# Patient Record
Sex: Female | Born: 1955 | Race: Black or African American | Hispanic: No | Marital: Single | Smoking: Current every day smoker
Health system: Southern US, Community
[De-identification: ages and names within clinical notes are randomized; demographics above are authoritative.]

## PROBLEM LIST (undated history)

## (undated) DIAGNOSIS — J449 Chronic obstructive pulmonary disease, unspecified: Secondary | ICD-10-CM

## (undated) DIAGNOSIS — G8929 Other chronic pain: Secondary | ICD-10-CM

---

## 2005-03-03 ENCOUNTER — Emergency Department: Payer: Self-pay | Admitting: General Practice

## 2005-11-08 ENCOUNTER — Emergency Department: Payer: Self-pay | Admitting: Emergency Medicine

## 2008-09-21 ENCOUNTER — Emergency Department: Payer: Self-pay | Admitting: Emergency Medicine

## 2009-03-28 ENCOUNTER — Emergency Department: Payer: Self-pay | Admitting: Emergency Medicine

## 2009-04-21 ENCOUNTER — Emergency Department: Payer: Self-pay | Admitting: Emergency Medicine

## 2009-05-14 ENCOUNTER — Inpatient Hospital Stay: Payer: Self-pay | Admitting: Internal Medicine

## 2009-08-22 ENCOUNTER — Encounter: Payer: Self-pay | Admitting: Family Medicine

## 2009-09-05 ENCOUNTER — Encounter: Payer: Self-pay | Admitting: Family Medicine

## 2009-10-13 ENCOUNTER — Ambulatory Visit: Payer: Self-pay | Admitting: Family Medicine

## 2010-09-06 ENCOUNTER — Emergency Department: Payer: Self-pay | Admitting: Unknown Physician Specialty

## 2011-06-01 ENCOUNTER — Emergency Department: Payer: Self-pay | Admitting: Emergency Medicine

## 2011-06-06 ENCOUNTER — Emergency Department: Payer: Self-pay | Admitting: Emergency Medicine

## 2011-06-29 ENCOUNTER — Emergency Department: Payer: Self-pay | Admitting: Emergency Medicine

## 2011-08-01 ENCOUNTER — Ambulatory Visit: Payer: Self-pay | Admitting: Pain Medicine

## 2011-12-04 ENCOUNTER — Emergency Department: Payer: Self-pay | Admitting: *Deleted

## 2011-12-04 LAB — CBC WITH DIFFERENTIAL/PLATELET
Eosinophil #: 0.1 10*3/uL (ref 0.0–0.7)
Eosinophil %: 0.9 %
HGB: 13.1 g/dL (ref 12.0–16.0)
Lymphocyte #: 1.4 10*3/uL (ref 1.0–3.6)
Lymphocyte %: 21.5 %
Monocyte %: 5.9 %
Platelet: 168 10*3/uL (ref 150–440)
RBC: 4.62 10*6/uL (ref 3.80–5.20)
WBC: 6.5 10*3/uL (ref 3.6–11.0)

## 2011-12-04 LAB — BASIC METABOLIC PANEL
Calcium, Total: 9.7 mg/dL (ref 8.5–10.1)
Chloride: 100 mmol/L (ref 98–107)
Glucose: 100 mg/dL — ABNORMAL HIGH (ref 65–99)
Osmolality: 274 (ref 275–301)
Potassium: 3.5 mmol/L (ref 3.5–5.1)
Sodium: 138 mmol/L (ref 136–145)

## 2012-05-24 ENCOUNTER — Emergency Department: Payer: Self-pay | Admitting: Emergency Medicine

## 2012-05-24 LAB — URINALYSIS, COMPLETE
Bilirubin,UR: NEGATIVE
Ketone: NEGATIVE
Ph: 5 (ref 4.5–8.0)
Specific Gravity: 1.011 (ref 1.003–1.030)
Squamous Epithelial: 9

## 2012-05-26 LAB — URINE CULTURE

## 2012-07-26 ENCOUNTER — Emergency Department: Payer: Self-pay | Admitting: Unknown Physician Specialty

## 2012-07-26 LAB — URINALYSIS, COMPLETE
Bilirubin,UR: NEGATIVE
Glucose,UR: NEGATIVE mg/dL (ref 0–75)
Ketone: NEGATIVE
Nitrite: NEGATIVE
Ph: 6 (ref 4.5–8.0)
RBC,UR: 2 /HPF (ref 0–5)
WBC UR: <1 /HPF (ref 0–5)

## 2012-07-29 ENCOUNTER — Emergency Department: Payer: Self-pay | Admitting: Emergency Medicine

## 2013-02-22 ENCOUNTER — Emergency Department: Payer: Self-pay | Admitting: Unknown Physician Specialty

## 2013-02-22 LAB — URINALYSIS, COMPLETE
Bilirubin,UR: NEGATIVE
Blood: NEGATIVE
Hyaline Cast: 1
Protein: NEGATIVE
RBC,UR: 2 /HPF (ref 0–5)
Specific Gravity: 1.018 (ref 1.003–1.030)
Squamous Epithelial: 10
WBC UR: 6 /HPF (ref 0–5)

## 2013-02-22 LAB — CBC
HCT: 37.2 % (ref 35.0–47.0)
MCHC: 33.2 g/dL (ref 32.0–36.0)
MCV: 86 fL (ref 80–100)
RBC: 4.35 10*6/uL (ref 3.80–5.20)
RDW: 13.2 % (ref 11.5–14.5)

## 2013-02-22 LAB — COMPREHENSIVE METABOLIC PANEL
Alkaline Phosphatase: 84 U/L (ref 50–136)
Anion Gap: 7 (ref 7–16)
BUN: 15 mg/dL (ref 7–18)
Chloride: 105 mmol/L (ref 98–107)
Co2: 27 mmol/L (ref 21–32)
EGFR (African American): >60
EGFR (Non-African Amer.): >60
Glucose: 92 mg/dL (ref 65–99)
Potassium: 3.4 mmol/L — ABNORMAL LOW (ref 3.5–5.1)
SGOT(AST): 18 U/L (ref 15–37)
SGPT (ALT): 14 U/L (ref 12–78)
Sodium: 139 mmol/L (ref 136–145)
Total Protein: 6.8 g/dL (ref 6.4–8.2)

## 2013-02-22 LAB — TROPONIN I: Troponin-I: <0.02 ng/mL

## 2013-05-24 ENCOUNTER — Emergency Department: Payer: Self-pay | Admitting: Emergency Medicine

## 2013-05-24 LAB — COMPREHENSIVE METABOLIC PANEL
Anion Gap: 4 — ABNORMAL LOW (ref 7–16)
Bilirubin,Total: 0.8 mg/dL (ref 0.2–1.0)
Calcium, Total: 9.3 mg/dL (ref 8.5–10.1)
Co2: 28 mmol/L (ref 21–32)
Glucose: 83 mg/dL (ref 65–99)
Potassium: 3.9 mmol/L (ref 3.5–5.1)
SGOT(AST): 23 U/L (ref 15–37)
SGPT (ALT): 19 U/L (ref 12–78)
Sodium: 137 mmol/L (ref 136–145)
Total Protein: 7.3 g/dL (ref 6.4–8.2)

## 2013-05-24 LAB — URINALYSIS, COMPLETE
Bilirubin,UR: NEGATIVE
Ketone: NEGATIVE
Protein: NEGATIVE
Squamous Epithelial: 18

## 2013-05-24 LAB — LIPASE, BLOOD: Lipase: 53 U/L — ABNORMAL LOW (ref 73–393)

## 2013-05-24 LAB — CBC
HCT: 38.1 % (ref 35.0–47.0)
HGB: 12.9 g/dL (ref 12.0–16.0)
MCV: 84 fL (ref 80–100)
Platelet: 194 10*3/uL (ref 150–440)
RBC: 4.54 10*6/uL (ref 3.80–5.20)
RDW: 13.2 % (ref 11.5–14.5)
WBC: 5.1 10*3/uL (ref 3.6–11.0)

## 2013-08-16 ENCOUNTER — Emergency Department: Payer: Self-pay | Admitting: Emergency Medicine

## 2014-05-31 ENCOUNTER — Emergency Department: Payer: Self-pay | Admitting: Emergency Medicine

## 2014-12-30 ENCOUNTER — Ambulatory Visit: Payer: Self-pay | Admitting: Dentistry

## 2015-02-25 ENCOUNTER — Other Ambulatory Visit: Payer: Self-pay | Admitting: Pediatrics

## 2015-02-25 DIAGNOSIS — F32A Depression, unspecified: Secondary | ICD-10-CM

## 2015-02-25 DIAGNOSIS — F329 Major depressive disorder, single episode, unspecified: Secondary | ICD-10-CM

## 2015-02-25 DIAGNOSIS — F419 Anxiety disorder, unspecified: Principal | ICD-10-CM

## 2015-02-25 DIAGNOSIS — R200 Anesthesia of skin: Secondary | ICD-10-CM

## 2015-02-28 ENCOUNTER — Other Ambulatory Visit: Payer: Self-pay | Admitting: Pediatrics

## 2015-02-28 DIAGNOSIS — F329 Major depressive disorder, single episode, unspecified: Secondary | ICD-10-CM

## 2015-02-28 DIAGNOSIS — F419 Anxiety disorder, unspecified: Principal | ICD-10-CM

## 2015-03-02 ENCOUNTER — Other Ambulatory Visit: Payer: Self-pay | Admitting: Pediatrics

## 2015-03-02 DIAGNOSIS — F419 Anxiety disorder, unspecified: Secondary | ICD-10-CM

## 2015-03-02 DIAGNOSIS — R200 Anesthesia of skin: Secondary | ICD-10-CM

## 2015-03-02 DIAGNOSIS — F32A Depression, unspecified: Secondary | ICD-10-CM

## 2015-03-02 DIAGNOSIS — F329 Major depressive disorder, single episode, unspecified: Secondary | ICD-10-CM

## 2015-12-28 ENCOUNTER — Telehealth: Payer: Self-pay | Admitting: Unknown Physician Specialty

## 2015-12-28 NOTE — Telephone Encounter (Signed)
Discussed with radiologist as pt is complaining of a milky discharge. He suggested a breast MRI if continuing symptoms

## 2020-03-15 ENCOUNTER — Other Ambulatory Visit: Payer: Self-pay

## 2020-03-15 ENCOUNTER — Emergency Department: Payer: Medicare (Managed Care)

## 2020-03-15 ENCOUNTER — Emergency Department
Admission: EM | Admit: 2020-03-15 | Discharge: 2020-03-16 | Disposition: A | Discharge status: Home / Self Care | Payer: Medicare (Managed Care) | Attending: Student in an Organized Health Care Education/Training Program | Admitting: Student in an Organized Health Care Education/Training Program

## 2020-03-15 DIAGNOSIS — R464 Slowness and poor responsiveness: Secondary | ICD-10-CM | POA: Insufficient documentation

## 2020-03-15 DIAGNOSIS — T40601A Poisoning by unspecified narcotics, accidental (unintentional), initial encounter: Secondary | ICD-10-CM | POA: Insufficient documentation

## 2020-03-15 DIAGNOSIS — J449 Chronic obstructive pulmonary disease, unspecified: Secondary | ICD-10-CM | POA: Insufficient documentation

## 2020-03-15 DIAGNOSIS — F17210 Nicotine dependence, cigarettes, uncomplicated: Secondary | ICD-10-CM | POA: Insufficient documentation

## 2020-03-15 DIAGNOSIS — R4189 Other symptoms and signs involving cognitive functions and awareness: Secondary | ICD-10-CM

## 2020-03-15 HISTORY — DX: Chronic obstructive pulmonary disease, unspecified: J44.9

## 2020-03-15 LAB — COMPREHENSIVE METABOLIC PANEL
ALT: 17 U/L (ref 0–44)
AST: 20 U/L (ref 15–41)
Albumin: 4.6 g/dL (ref 3.5–5.0)
Alkaline Phosphatase: 74 U/L (ref 38–126)
Anion gap: 7 (ref 5–15)
BUN: 22 mg/dL (ref 8–23)
CO2: 31 mmol/L (ref 22–32)
Calcium: 9.7 mg/dL (ref 8.9–10.3)
Chloride: 102 mmol/L (ref 98–111)
Creatinine, Ser: 0.94 mg/dL (ref 0.44–1.00)
GFR calc Af Amer: >60 mL/min (ref >60)
GFR calc non Af Amer: >60 mL/min (ref >60)
Glucose, Bld: 117 mg/dL — ABNORMAL HIGH (ref 70–99)
Potassium: 3.7 mmol/L (ref 3.5–5.1)
Sodium: 140 mmol/L (ref 135–145)
Total Bilirubin: 0.7 mg/dL (ref 0.3–1.2)
Total Protein: 7.7 g/dL (ref 6.5–8.1)

## 2020-03-15 LAB — CBC WITH DIFFERENTIAL/PLATELET
Abs Immature Granulocytes: 0.01 10*3/uL (ref 0.00–0.07)
Basophils Absolute: 0 10*3/uL (ref 0.0–0.1)
Basophils Relative: 1 %
Eosinophils Absolute: 0.4 10*3/uL (ref 0.0–0.5)
Eosinophils Relative: 5 %
HCT: 40.4 % (ref 36.0–46.0)
Hemoglobin: 13.4 g/dL (ref 12.0–15.0)
Immature Granulocytes: 0 %
Lymphocytes Relative: 37 %
Lymphs Abs: 2.8 10*3/uL (ref 0.7–4.0)
MCH: 29.4 pg (ref 26.0–34.0)
MCHC: 33.2 g/dL (ref 30.0–36.0)
MCV: 88.6 fL (ref 80.0–100.0)
Monocytes Absolute: 0.8 10*3/uL (ref 0.1–1.0)
Monocytes Relative: 11 %
Neutro Abs: 3.5 10*3/uL (ref 1.7–7.7)
Neutrophils Relative %: 46 %
Platelets: 216 10*3/uL (ref 150–400)
RBC: 4.56 MIL/uL (ref 3.87–5.11)
RDW: 13.4 % (ref 11.5–15.5)
WBC: 7.5 10*3/uL (ref 4.0–10.5)
nRBC: 0 % (ref 0.0–0.2)

## 2020-03-15 LAB — TROPONIN I (HIGH SENSITIVITY): Troponin I (High Sensitivity): 7 ng/L (ref <18)

## 2020-03-15 NOTE — ED Provider Notes (Signed)
[facility] Emergency Department Provider Note    First MD Initiated Contact with Patient 03/15/20 2237     (approximate)  I have reviewed the triage vital signs and the nursing notes.   HISTORY  Chief Complaint Loss of Consciousness    HPI Pamela Mcclure is a 64 y.o. female history of COPD as well as chronic pain states that she takes OxyContin at home states that she was drinking with friend this evening.  EMS was called due to altered mental status and unresponsiveness.  Patient had slow respirations initially requiring assisted ventilations.  She was given IV Narcan with return of spontaneous respirations and resolution of her altered mental status.  She denies any pain no chest pain or shortness of breath.  Denies any intentional overdose.  Does admit to taking pain medication tonight and EtOH.    Past Medical History:  Diagnosis Date  . COPD (chronic obstructive pulmonary disease) (HCC)    No family history on file. History reviewed. No pertinent surgical history. There are no problems to display for this patient.     Prior to Admission medications   Not on File    Allergies Patient has no known allergies.    Social History Social History   Tobacco Use  . Smoking status: Current Every Day Smoker    Packs/day: 0.50  . Smokeless tobacco: Never Used  Substance Use Topics  . Alcohol use: Yes    Comment: not daily   . Drug use: Never    Review of Systems Patient denies headaches, rhinorrhea, blurry vision, numbness, shortness of breath, chest pain, edema, cough, abdominal pain, nausea, vomiting, diarrhea, dysuria, fevers, rashes or hallucinations unless otherwise stated above in HPI. ____________________________________________   PHYSICAL EXAM:  VITAL SIGNS: Vitals:   03/15/20 2226  BP: (!) 149/91  Pulse: 89  Resp: 15  Temp: 98 F (36.7 C)  SpO2: 97%    Constitutional: Alert and oriented.  Eyes: Conjunctivae are normal.  Pupils 61mm and reactive  Head: Atraumatic. Nose: No congestion/rhinnorhea. Mouth/Throat: Mucous membranes are moist.   Neck: No stridor. Painless ROM.  Cardiovascular: Normal rate, regular rhythm. Grossly normal heart sounds.  Good peripheral circulation. Respiratory: Normal respiratory effort.  No retractions. Lungs CTAB. Gastrointestinal: Soft and nontender. No distention. No abdominal bruits. No CVA tenderness. Genitourinary: deferred Musculoskeletal: No lower extremity tenderness nor edema.  No joint effusions. Neurologic:  CN- intact.  No facial droop, Normal FNF.  Normal heel to shin.  Sensation intact bilaterally. Normal speech and language. No gross focal neurologic deficits are appreciated. No gait instability.  Skin:  Skin is warm, dry and intact. No rash noted. Psychiatric: Mood and affect are normal. Speech and behavior are normal.  ____________________________________________   LABS (all labs ordered are listed, but only abnormal results are displayed)  Results for orders placed or performed during the hospital encounter of 03/15/20 (from the past 24 hour(s))  CBC with Differential/Platelet     Status: None   Collection Time: 03/15/20 10:38 PM  Result Value Ref Range   WBC 7.5 4.0 - 10.5 K/uL   RBC 4.56 3.87 - 5.11 MIL/uL   Hemoglobin 13.4 12.0 - 15.0 g/dL   HCT 96.7 89.3 - 81.0 %   MCV 88.6 80.0 - 100.0 fL   MCH 29.4 26.0 - 34.0 pg   MCHC 33.2 30.0 - 36.0 g/dL   RDW 17.5 10.2 - 58.5 %   Platelets 216 150 - 400 K/uL   nRBC 0.0 0.0 -  0.2 %   Neutrophils Relative % 46 %   Neutro Abs 3.5 1.7 - 7.7 K/uL   Lymphocytes Relative 37 %   Lymphs Abs 2.8 0.7 - 4.0 K/uL   Monocytes Relative 11 %   Monocytes Absolute 0.8 0.1 - 1.0 K/uL   Eosinophils Relative 5 %   Eosinophils Absolute 0.4 0.0 - 0.5 K/uL   Basophils Relative 1 %   Basophils Absolute 0.0 0.0 - 0.1 K/uL   Immature Granulocytes 0 %   Abs Immature Granulocytes 0.01 0.00 - 0.07 K/uL  Comprehensive metabolic  panel     Status: Abnormal   Collection Time: 03/15/20 10:38 PM  Result Value Ref Range   Sodium 140 135 - 145 mmol/L   Potassium 3.7 3.5 - 5.1 mmol/L   Chloride 102 98 - 111 mmol/L   CO2 31 22 - 32 mmol/L   Glucose, Bld 117 (H) 70 - 99 mg/dL   BUN 22 8 - 23 mg/dL   Creatinine, Ser 2.69 0.44 - 1.00 mg/dL   Calcium 9.7 8.9 - 48.5 mg/dL   Total Protein 7.7 6.5 - 8.1 g/dL   Albumin 4.6 3.5 - 5.0 g/dL   AST 20 15 - 41 U/L   ALT 17 0 - 44 U/L   Alkaline Phosphatase 74 38 - 126 U/L   Total Bilirubin 0.7 0.3 - 1.2 mg/dL   GFR calc non Af Amer >60 >60 mL/min   GFR calc Af Amer >60 >60 mL/min   Anion gap 7 5 - 15  Troponin I (High Sensitivity)     Status: None   Collection Time: 03/15/20 10:38 PM  Result Value Ref Range   Troponin I (High Sensitivity) 7 <18 ng/L   ____________________________________________  EKG My review and personal interpretation at Time: 22:26   Indication: unresponsive  Rate: 85  Rhythm: sinus Axis: normal Other: normal intervals, no stemi ____________________________________________  RADIOLOGY  I personally reviewed all radiographic images ordered to evaluate for the above acute complaints and reviewed radiology reports and findings.  These findings were personally discussed with the patient.  Please see medical record for radiology report.  ____________________________________________   PROCEDURES  Procedure(s) performed:  Procedures    Critical Care performed: no ____________________________________________   INITIAL IMPRESSION / ASSESSMENT AND PLAN / ED COURSE  Pertinent labs & imaging results that were available during my care of the patient were reviewed by me and considered in my medical decision making (see chart for details).   DDX: Overdose, dysrhythmia, electrolyte abnormality, CHF, respiratory failure, TIA, CVA  Pamela Mcclure is a 64 y.o. who presents to the ED with symptoms as described above.  Patient now nontoxic-appearing with  spontaneous respirations after receiving Narcan.  Does admit to drinking and taking pain medication for chronic back pain.  Denies any intentional overdose.  Will check blood work as well as imaging given her age and risk factors.  Patient be signed out to oncoming physician pending period of observation and reassessment.  The patient will be placed on continuous pulse oximetry and telemetry for monitoring.  Laboratory evaluation will be sent to evaluate for the above complaints.        The patient was evaluated in Emergency Department today for the symptoms described in the history of present illness. He/she was evaluated in the context of the global COVID-19 pandemic, which necessitated consideration that the patient might be at risk for infection with the SARS-CoV-2 virus that causes COVID-19. Institutional protocols and algorithms that pertain to the  evaluation of patients at risk for COVID-19 are in a state of rapid change based on information released by regulatory bodies including the CDC and federal and state organizations. These policies and algorithms were followed during the patient's care in the ED.  As part of my medical decision making, I reviewed the following data within the [facility] notes reviewed and incorporated, Labs reviewed, notes from prior ED visits and [place] Controlled Substance Database   ____________________________________________   FINAL CLINICAL IMPRESSION(S) / ED DIAGNOSES  Final diagnoses:  Episode of unresponsiveness      NEW MEDICATIONS STARTED DURING THIS VISIT:  New Prescriptions   No medications on file     Note:  This document was prepared using Dragon voice recognition software and may include unintentional dictation errors.    Merlyn Lot, MD 03/15/20 2312

## 2020-03-15 NOTE — ED Notes (Signed)
Attempted to get urine sample from pt, pt states "I'm alright". Requesting something to eat. Will check with MD after scan has resulted. Warm blanket given.

## 2020-03-15 NOTE — ED Notes (Signed)
Pt returned from ct

## 2020-03-15 NOTE — ED Triage Notes (Signed)
PT to ED via EMS from her cousins house. PT was takling to cousin and developed right upward gave and went unresponsive. Upon EMS arrival, pt was apneac. EMS bagged pt for a few minutes, gave 1mg  Narcan IV and pt becAME MORE alert and breathing on her own. Upon arrival pt is alert and oriented 99% on room air.

## 2020-03-16 NOTE — ED Provider Notes (Addendum)
I assumed care of the patient from Dr. Roxan Hockey at 11:00 PM.  Patient son presented to the emergency department.  I spoke with the patient after she excused her son from the room.  Patient informed me that she crushed and snorted to OxyContin 10 mg tablets today.  She states that she usually presents notes her OxyContin however today she "overdid it".  Patient denied any suicidal ideation.  I spoke to the patient at length regarding the dangers of doing so and that it could have resulted in her death today.  Patient voiced understanding.  Patient will be discharged home in the custody of her son.   Darci Current, MD 03/16/20 1593    Darci Current, MD 03/16/20 [phone]

## 2020-05-03 ENCOUNTER — Emergency Department
Admission: EM | Admit: 2020-05-03 | Discharge: 2020-05-03 | Disposition: A | Discharge status: Home / Self Care | Payer: Medicare (Managed Care) | Attending: Emergency Medicine | Admitting: Emergency Medicine

## 2020-05-03 ENCOUNTER — Encounter: Payer: Self-pay | Admitting: Emergency Medicine

## 2020-05-03 ENCOUNTER — Other Ambulatory Visit: Payer: Self-pay

## 2020-05-03 DIAGNOSIS — J449 Chronic obstructive pulmonary disease, unspecified: Secondary | ICD-10-CM | POA: Diagnosis not present

## 2020-05-03 DIAGNOSIS — F17210 Nicotine dependence, cigarettes, uncomplicated: Secondary | ICD-10-CM | POA: Diagnosis not present

## 2020-05-03 DIAGNOSIS — M545 Low back pain: Secondary | ICD-10-CM | POA: Diagnosis present

## 2020-05-03 DIAGNOSIS — G8929 Other chronic pain: Secondary | ICD-10-CM

## 2020-05-03 HISTORY — DX: Other chronic pain: G89.29

## 2020-05-03 MED ORDER — MORPHINE SULFATE (PF) 4 MG/ML IV SOLN
4.0000 mg | Freq: Once | INTRAVENOUS | Status: DC
  Filled 2020-05-03: qty 1

## 2020-05-03 MED ORDER — METHOCARBAMOL 500 MG PO TABS: 500.0000 mg | ORAL_TABLET | Freq: Four times a day (QID) | ORAL | 0 refills | Status: DC

## 2020-05-03 MED ORDER — MORPHINE SULFATE (PF) 4 MG/ML IV SOLN
4.0000 mg | Freq: Once | INTRAVENOUS | Status: AC
  Administered 2020-05-03: 4 mg via INTRAMUSCULAR

## 2020-05-03 MED ORDER — ONDANSETRON 8 MG PO TBDP
8.0000 mg | ORAL_TABLET | Freq: Once | ORAL | Status: AC
  Administered 2020-05-03: 8 mg via ORAL
  Filled 2020-05-03: qty 1

## 2020-05-03 MED ORDER — PREDNISONE 50 MG PO TABS
50.0000 mg | ORAL_TABLET | Freq: Every day | ORAL | 0 refills | Status: DC
Start: 2020-05-03 — End: 2021-12-11

## 2020-05-03 NOTE — ED Triage Notes (Signed)
Pt comes into the ED via EMS from a friends house, hx of chronic back pain, states she fell and cant walk, asking for pain meds. States she is out of her oxycodone.

## 2020-05-03 NOTE — ED Notes (Signed)
See triage note. Pt also states her fall was from "my back giving out" and pt caught herself. Pt states her Dr is in [place] and she does not have the funds to go back and get her medications and is requesting "Oxy 10s" to go home with for her back that she has been out of for 3 days

## 2020-05-03 NOTE — ED Provider Notes (Signed)
[facility] Emergency Department Provider Note  ____________________________________________  Time seen: Approximately 8:32 PM  I have reviewed the triage vital signs and the nursing notes.   HISTORY  Chief Complaint Back Pain    HPI Pamela Mcclure is a 64 y.o. female who presents the emergency department complaining chronic low back pain.  Patient states that she is on chronic oxycodone for her pain.  Patient receives pain medicine from [place] with her pain management specialist.  Patient states that she is visiting [place] and is out of her pain meds.  She states that she has been out of pain medication for 3 days.  She is requesting a refill at this time.  She denies any recent trauma.  No bowel or bladder dysfunction, saddle anesthesia or paresthesias.  No urinary or GI complaints.         Past Medical History:  Diagnosis Date  . Chronic back pain    L 5  . COPD (chronic obstructive pulmonary disease) (HCC)     There are no problems to display for this patient.   History reviewed. No pertinent surgical history.  Prior to Admission medications   Medication Sig Start Date End Date Taking? Authorizing Provider  methocarbamol (ROBAXIN) 500 MG tablet Take 1 tablet (500 mg total) by mouth 4 (four) times daily. 05/03/20   Kadee Philyaw, Delorise Royals, PA-C  predniSONE (DELTASONE) 50 MG tablet Take 1 tablet (50 mg total) by mouth daily with breakfast. 05/03/20   Njeri Vicente, Delorise Royals, PA-C    Allergies Hydrocodone-acetaminophen  No family history on file.  Social History Social History   Tobacco Use  . Smoking status: Current Every Day Smoker    Packs/day: 0.50  . Smokeless tobacco: Never Used  Substance Use Topics  . Alcohol use: Yes    Comment: not daily   . Drug use: Never     Review of Systems  Constitutional: No fever/chills Eyes: No visual changes. No discharge ENT: No upper respiratory complaints. Cardiovascular: no chest  pain. Respiratory: no cough. No SOB. Gastrointestinal: No abdominal pain.  No nausea, no vomiting.  No diarrhea.  No constipation. Genitourinary: Negative for dysuria. No hematuria Musculoskeletal: Chronic low back pain Skin: Negative for rash, abrasions, lacerations, ecchymosis. Neurological: Negative for headaches, focal weakness or numbness. 10-point ROS otherwise negative.  ____________________________________________   PHYSICAL EXAM:  VITAL SIGNS: ED Triage Vitals  Enc Vitals Group     BP 05/03/20 1912 115/76     Pulse Rate 05/03/20 1912 94     Resp 05/03/20 1912 18     Temp 05/03/20 1912 98.4 F (36.9 C)     Temp Source 05/03/20 1912 Oral     SpO2 05/03/20 1912 98 %     Weight 05/03/20 1913 125 lb (56.7 kg)     Height 05/03/20 1913 5' 8.5" (1.74 m)     Head Circumference --      Peak Flow --      Pain Score 05/03/20 1913 9     Pain Loc --      Pain Edu? --      Excl. in GC? --      Constitutional: Alert and oriented. Well appearing and in no acute distress. Eyes: Conjunctivae are normal. PERRL. EOMI. Head: Atraumatic. ENT:      Ears:       Nose: No congestion/rhinnorhea.      Mouth/Throat: Mucous membranes are moist.  Neck: No stridor.    Cardiovascular: Normal rate, regular  rhythm. Normal S1 and S2.  Good peripheral circulation. Respiratory: Normal respiratory effort without tachypnea or retractions. Lungs CTAB. Good air entry to the bases with no decreased or absent breath sounds. Gastrointestinal: Bowel sounds 4 quadrants. Soft and nontender to palpation. No guarding or rigidity. No palpable masses. No distention. No CVA tenderness. Musculoskeletal: Full range of motion to all extremities. No gross deformities appreciated.  Visualization of the lower back reveals no visible signs of trauma.  Patient reports mild diffuse tenderness throughout the lumbar region without point specific tenderness.  No palpable abnormality.  Dorsalis pedis pulse and sensation intact  distally. Neurologic:  Normal speech and language. No gross focal neurologic deficits are appreciated.  Skin:  Skin is warm, dry and intact. No rash noted. Psychiatric: Mood and affect are normal. Speech and behavior are normal. Patient exhibits appropriate insight and judgement.   ____________________________________________   LABS (all labs ordered are listed, but only abnormal results are displayed)  Labs Reviewed - No data to display ____________________________________________  EKG   ____________________________________________  RADIOLOGY   No results found.  ____________________________________________    PROCEDURES  Procedure(s) performed:    Procedures    Medications  ondansetron (ZOFRAN-ODT) disintegrating tablet 8 mg (8 mg Oral Given 05/03/20 2054)  morphine 4 MG/ML injection 4 mg (4 mg Intramuscular Given 05/03/20 2054)     ____________________________________________   INITIAL IMPRESSION / ASSESSMENT AND PLAN / ED COURSE  Pertinent labs & imaging results that were available during my care of the patient were reviewed by me and considered in my medical decision making (see chart for details).  Review of the [place] CSRS was performed in accordance of the [facility] prior to dispensing any controlled drugs.           Patient's diagnosis is consistent with chronic low back pain.  Patient presents emergency department requesting pain medication refill.  Patient is chronic low back pain, states that she receives oxycodone prescriptions from [place] through her pain management specialist.  She states that she is visiting this area and ran out 3 days ago.  On review of the controlled substance database, patient was regularly receiving narcotics from a provider in [place] until April.  Since then patient has not received any narcotics.  Patient was also seen in this department 2 months ago for an overdose on a narcotic.  At this time I will definitely not refill any  narcotics.  Advised that I would give a dose of pain medicine here in the emergency department and give her steroids and muscle relaxer at home.  Patient is agreeable with this plan.  I advised that she may follow-up with her pain management specialist in [place] if she requires further narcotic prescriptions..  Patient is given ED precautions to return to the ED for any worsening or new symptoms.     ____________________________________________  FINAL CLINICAL IMPRESSION(S) / ED DIAGNOSES  Final diagnoses:  Chronic midline low back pain without sciatica      NEW MEDICATIONS STARTED DURING THIS VISIT:  ED Discharge Orders         Ordered    predniSONE (DELTASONE) 50 MG tablet  Daily with breakfast     Discontinue  Reprint     05/03/20 2100    methocarbamol (ROBAXIN) 500 MG tablet  4 times daily     Discontinue  Reprint     05/03/20 2100              This chart was dictated  using voice recognition software/Dragon. Despite best efforts to proofread, errors can occur which can change the meaning. Any change was purely unintentional.    Lanette Hampshire 05/03/20 2103    Sharman Cheek, MD 05/03/20 2322

## 2020-05-03 NOTE — ED Triage Notes (Signed)
PT arrived via [facility] reports chronic back pain that worsened after walking to the store. Pt states she takes "Oxy 10s" states she ran out 3 days ago and is from [place], [place] and can't get back up there until next week.  No distress noted on arrival. PT c/o worse pain from sitting in wheelchair.

## 2020-05-03 NOTE — ED Notes (Signed)
Pt provided with [place] sandwich tray and ginger ale per request. Currently in restroom at this time

## 2021-05-20 ENCOUNTER — Emergency Department
Admission: EM | Admit: 2021-05-20 | Discharge: 2021-05-20 | Disposition: A | Discharge status: Home / Self Care | Payer: Medicare (Managed Care) | Attending: Emergency Medicine | Admitting: Emergency Medicine

## 2021-05-20 ENCOUNTER — Encounter: Payer: Self-pay | Admitting: Emergency Medicine

## 2021-05-20 ENCOUNTER — Other Ambulatory Visit: Payer: Self-pay

## 2021-05-20 DIAGNOSIS — J449 Chronic obstructive pulmonary disease, unspecified: Secondary | ICD-10-CM | POA: Diagnosis not present

## 2021-05-20 DIAGNOSIS — F17210 Nicotine dependence, cigarettes, uncomplicated: Secondary | ICD-10-CM | POA: Diagnosis not present

## 2021-05-20 DIAGNOSIS — Z760 Encounter for issue of repeat prescription: Secondary | ICD-10-CM | POA: Insufficient documentation

## 2021-05-20 MED ORDER — GABAPENTIN 100 MG PO CAPS
100.0000 mg | ORAL_CAPSULE | Freq: Three times a day (TID) | ORAL | 0 refills | Status: DC
Start: 2021-05-20 — End: 2021-12-25

## 2021-05-20 NOTE — Discharge Instructions (Addendum)
Unfortunately we cannot prescribe suboxone

## 2021-05-20 NOTE — ED Provider Notes (Signed)
[facility] Emergency Department Provider Note   ____________________________________________    I have reviewed the triage vital signs and the nursing notes.   HISTORY  Chief Complaint Medication Refill     HPI Pamela Mcclure is a 65 y.o. female who presents to the emergency department for medication refill.  Patient reports that she is on Suboxone chronically, is requesting a Suboxone refill.  She also notes she is on gabapentin for chronic back pain  Past Medical History:  Diagnosis Date   Chronic back pain    L 5   COPD (chronic obstructive pulmonary disease) (HCC)     There are no problems to display for this patient.   History reviewed. No pertinent surgical history.  Prior to Admission medications   Medication Sig Start Date End Date Taking? Authorizing Provider  gabapentin (NEURONTIN) 100 MG capsule Take 1 capsule (100 mg total) by mouth 3 (three) times daily for 10 days. 05/20/21 05/30/21 Yes Jene Every, MD  methocarbamol (ROBAXIN) 500 MG tablet Take 1 tablet (500 mg total) by mouth 4 (four) times daily. 05/03/20   Cuthriell, Delorise Royals, PA-C  predniSONE (DELTASONE) 50 MG tablet Take 1 tablet (50 mg total) by mouth daily with breakfast. 05/03/20   Cuthriell, Delorise Royals, PA-C     Allergies Hydrocodone-acetaminophen  No family history on file.  Social History Social History   Tobacco Use   Smoking status: Every Day    Packs/day: 0.50    Types: Cigarettes   Smokeless tobacco: Never  Substance Use Topics   Alcohol use: Yes    Comment: not daily    Drug use: Never    Review of Systems  Constitutional: No fever/chills    Gastrointestinal: No abdominal pain.  No nausea, no vomiting.   Genitourinary: Negative for dysuria. Musculoskeletal: Chronic back pain Skin: Negative for rash. Neurological: Negative for headaches     ____________________________________________   PHYSICAL EXAM:  VITAL SIGNS: ED Triage Vitals   Enc Vitals Group     BP 05/20/21 1253 124/82     Pulse Rate 05/20/21 1253 77     Resp 05/20/21 1253 16     Temp 05/20/21 1253 98.1 F (36.7 C)     Temp Source 05/20/21 1253 Oral     SpO2 05/20/21 1253 98 %     Weight 05/20/21 1250 57.2 kg (126 lb)     Height 05/20/21 1250 1.727 m (5\' 8" )     Head Circumference --      Peak Flow --      Pain Score 05/20/21 1250 8     Pain Loc --      Pain Edu? --      Excl. in GC? --      Constitutional: Alert and oriented. No acute distress. Pleasant and interactive Eyes: Conjunctivae are normal.  Head: Atraumatic. Nose: No congestion/rhinnorhea. Mouth/Throat: Mucous membranes are moist.    Respiratory: Normal respiratory effort.  No retractions. Genitourinary: deferred Musculoskeletal: Ambulating well, no vertebral tenderness palpation Neurologic:  Normal speech and language. No gross focal neurologic deficits are appreciated.   Skin:  Skin is warm, dry and intact. No rash noted.   ____________________________________________   LABS (all labs ordered are listed, but only abnormal results are displayed)  Labs Reviewed - No data to display ____________________________________________  EKG   ____________________________________________  RADIOLOGY   ____________________________________________   PROCEDURES  Procedure(s) performed: No  Procedures   Critical Care performed: No ____________________________________________   INITIAL IMPRESSION /  ASSESSMENT AND PLAN / ED COURSE  Pertinent labs & imaging results that were available during my care of the patient were reviewed by me and considered in my medical decision making (see chart for details).   Discussed with patient unable to refill Suboxone, did refill gabapentin   ____________________________________________   FINAL CLINICAL IMPRESSION(S) / ED DIAGNOSES  Final diagnoses:  Encounter for medication refill      NEW MEDICATIONS STARTED DURING THIS  VISIT:  Discharge Medication List as of 05/20/2021  1:08 PM     START taking these medications   Details  gabapentin (NEURONTIN) 100 MG capsule Take 1 capsule (100 mg total) by mouth 3 (three) times daily for 10 days., Starting Sat 05/20/2021, Until Tue 05/30/2021, Normal         Note:  This document was prepared using Dragon voice recognition software and may include unintentional dictation errors.    Jene Every, MD 05/20/21 1440

## 2021-05-20 NOTE — ED Notes (Signed)
Pt verbalizes understanding of d/c instructions and follow up. 

## 2021-05-20 NOTE — ED Triage Notes (Signed)
Pt reports is in town for a funeral and ran out of her suboxone and needs a refill until she can get back to [place]

## 2021-11-07 ENCOUNTER — Other Ambulatory Visit: Payer: Self-pay

## 2021-11-07 ENCOUNTER — Emergency Department: Payer: Medicare Other

## 2021-11-07 ENCOUNTER — Emergency Department
Admission: EM | Admit: 2021-11-07 | Discharge: 2021-11-07 | Disposition: A | Discharge status: Home / Self Care | Payer: Medicare Other | Attending: Emergency Medicine | Admitting: Emergency Medicine

## 2021-11-07 DIAGNOSIS — M545 Low back pain, unspecified: Secondary | ICD-10-CM | POA: Insufficient documentation

## 2021-11-07 DIAGNOSIS — J449 Chronic obstructive pulmonary disease, unspecified: Secondary | ICD-10-CM | POA: Insufficient documentation

## 2021-11-07 DIAGNOSIS — G8929 Other chronic pain: Secondary | ICD-10-CM | POA: Insufficient documentation

## 2021-11-07 MED ORDER — LIDOCAINE 5 % EX PTCH
1.0000 | MEDICATED_PATCH | CUTANEOUS | Status: DC
  Administered 2021-11-07: 1 via TRANSDERMAL
  Filled 2021-11-07: qty 1

## 2021-11-07 MED ORDER — LIDOCAINE 5 % EX PTCH: 1.0000 | MEDICATED_PATCH | CUTANEOUS | 0 refills | Status: DC

## 2021-11-07 MED ORDER — OXYCODONE-ACETAMINOPHEN 5-325 MG PO TABS
2.0000 | ORAL_TABLET | Freq: Once | ORAL | Status: AC
  Administered 2021-11-07: 2 via ORAL
  Filled 2021-11-07: qty 2

## 2021-11-07 NOTE — ED Provider Notes (Signed)
[facility] Provider Note    Event Date/Time   First MD Initiated Contact with Patient 11/07/21 0423     (approximate)   History   Back Pain   HPI  Pamela Mcclure is a 66 y.o. female brought to the ED via EMS from home with a chief complaint of chronic back pain.  Patient is from out of town, here for her brother's funeral.  History of chronic back pain who ran out of her Percocet 10mg .  Reports legs gave way and she slid down a chair onto the floor.  Denies striking head or LOC.  Denies anticoagulant use.  Denies extremity weakness/numbness or tingling.  Denies dysuria.     Past Medical History   Past Medical History:  Diagnosis Date   Chronic back pain    L 5   COPD (chronic obstructive pulmonary disease) (HCC)      Active Problem List  There are no problems to display for this patient.    Past Surgical History  No past surgical history on file.   Home Medications   Prior to Admission medications   Medication Sig Start Date End Date Taking? Authorizing Provider  gabapentin (NEURONTIN) 100 MG capsule Take 1 capsule (100 mg total) by mouth 3 (three) times daily for 10 days. 05/20/21 05/30/21  Lavonia Drafts, MD  methocarbamol (ROBAXIN) 500 MG tablet Take 1 tablet (500 mg total) by mouth 4 (four) times daily. 05/03/20   Cuthriell, Charline Bills, PA-C  predniSONE (DELTASONE) 50 MG tablet Take 1 tablet (50 mg total) by mouth daily with breakfast. 05/03/20   Cuthriell, Charline Bills, PA-C     Allergies  Hydrocodone-acetaminophen   Family History  No family history on file.   Physical Exam  Triage Vital Signs: ED Triage Vitals  Enc Vitals Group     BP 11/07/21 0157 (!) 112/50     Pulse Rate 11/07/21 0157 75     Resp 11/07/21 0157 18     Temp 11/07/21 0157 97.9 F (36.6 C)     Temp Source 11/07/21 0157 Oral     SpO2 11/07/21 0157 98 %     Weight 11/07/21 0158 130 lb (59 kg)     Height 11/07/21 0158 5\' 8"  (1.727 m)     Head Circumference --       Peak Flow --      Pain Score 11/07/21 0158 10     Pain Loc --      Pain Edu? --      Excl. in [place]? --     Updated Vital Signs: BP (!) 112/50 (BP Location: Left Arm)    Pulse 75    Temp 97.9 F (36.6 C) (Oral)    Resp 18    Ht 5\' 8"  (1.727 m)    Wt 59 kg    SpO2 98%    BMI 19.77 kg/m    General: Awake, no distress.  CV:  Good peripheral perfusion.  Resp:  Normal effort.  Abd:  No distention.  Other:  Midline lumbar spine tenderness to palpation without step-offs or deformities.  No vesicles.   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 No acute fracture or listhesis. Vertebral body height is preserved. Severe intervertebral disc space narrowing and endplate remodeling at 075-GRM is again noted in keeping with changes of advanced degenerative disc disease. Mild progressive degenerative disc disease is noted at T12-L1 and L1-L2 with endplate remodeling. Moderate facet arthrosis on the right at L3-4. The paraspinal soft tissues are unremarkable. IMPRESSION: No acute fracture or listhesis. Progressive degenerative change. Transitional lumbar anatomy. Electronically Signed   By: Fidela Salisbury M.D.   On: 11/07/2021 02:34     PROCEDURES:  Critical Care performed: No  Procedures   MEDICATIONS ORDERED IN ED: Medications  oxyCODONE-acetaminophen (PERCOCET/ROXICET) 5-325 MG per tablet 2 tablet (has no administration in time range)  lidocaine (LIDODERM) 5 % 1 patch (has no administration in  time range)     IMPRESSION / MDM / ASSESSMENT AND PLAN / ED COURSE  I reviewed the triage vital signs and the nursing notes.                              Differential diagnosis includes, but is not limited to, back pain secondary to running out of medication, fracture, dislocation, strain, etc.  X-rays negative for acute traumatic injury.  Will administer one-time dose Percocet and apply lidocaine patch.  Will discharge home with prescription for lidocaine patch.  Patient asking for pain clinic referral.  Strict return precautions given.  Patient verbalizes understanding agrees with plan of care.         FINAL CLINICAL IMPRESSION(S) / ED DIAGNOSES   Final diagnoses:  Chronic midline low back pain without sciatica     Rx / DC Orders   ED Discharge Orders     None        Note:  This document was prepared using Dragon voice recognition software and may include unintentional dictation errors.   Paulette Blanch, MD 11/07/21 [phone]

## 2021-11-07 NOTE — ED Provider Notes (Signed)
Emergency Medicine Provider Triage Evaluation Note  Pamela Mcclure , a 66 y.o. female  was evaluated in triage.  Pt complains of acute on chronic back pain. Legs gave out and patient slid down onto her tailbone.  Review of Systems  Positive: Back pain Negative: Extremity weakness/numbness/tingling  Physical Exam  There were no vitals taken for this visit. Gen:   Awake, no distress   Resp:  Normal effort  MSK:   Moves extremities without difficulty  Other:    Medical Decision Making  Medically screening exam initiated at 1:57 AM.  Appropriate orders placed.  Pamela Mcclure was informed that the remainder of the evaluation will be completed by another provider, this initial triage assessment does not replace that evaluation, and the importance of remaining in the ED until their evaluation is complete.  66 year old female presenting with acute on chronic back pain.  Will obtain x-rays and UA while patient is awaiting treatment room.   Paulette Blanch, MD 11/07/21 0157

## 2021-11-07 NOTE — ED Triage Notes (Signed)
Pt in with co back pain states has lumbar compression and ran out of medicine.

## 2021-11-07 NOTE — Discharge Instructions (Signed)
Apply Lidocaine patch to affected area as instructed.  Return to the ER for worsening symptoms, extremity weakness/numbness/tingling or other concerns.

## 2021-11-21 ENCOUNTER — Ambulatory Visit: Payer: Self-pay

## 2021-12-10 ENCOUNTER — Emergency Department
Admission: EM | Admit: 2021-12-10 | Discharge: 2021-12-10 | Disposition: A | Discharge status: Home / Self Care | Payer: Medicare Other | Attending: Emergency Medicine | Admitting: Emergency Medicine

## 2021-12-10 DIAGNOSIS — G8929 Other chronic pain: Secondary | ICD-10-CM | POA: Diagnosis not present

## 2021-12-10 DIAGNOSIS — M545 Low back pain, unspecified: Secondary | ICD-10-CM | POA: Insufficient documentation

## 2021-12-10 DIAGNOSIS — J449 Chronic obstructive pulmonary disease, unspecified: Secondary | ICD-10-CM | POA: Insufficient documentation

## 2021-12-10 MED ORDER — OXYCODONE-ACETAMINOPHEN 5-325 MG PO TABS
2.0000 | ORAL_TABLET | Freq: Once | ORAL | Status: AC
  Administered 2021-12-10: 2 via ORAL
  Filled 2021-12-10: qty 2

## 2021-12-10 MED ORDER — LIDOCAINE 5 % EX PTCH: 1.0000 | MEDICATED_PATCH | Freq: Two times a day (BID) | CUTANEOUS | 0 refills | Status: DC

## 2021-12-10 MED ORDER — CYCLOBENZAPRINE HCL 5 MG PO TABS: 5.0000 mg | ORAL_TABLET | Freq: Three times a day (TID) | ORAL | 0 refills | Status: AC | PRN

## 2021-12-10 MED ORDER — LIDOCAINE 5 % EX PTCH
1.0000 | MEDICATED_PATCH | Freq: Once | CUTANEOUS | Status: DC
  Administered 2021-12-10: 1 via TRANSDERMAL
  Filled 2021-12-10: qty 1

## 2021-12-10 NOTE — ED Provider Notes (Signed)
[facility] Provider Note    Event Date/Time   First MD Initiated Contact with Patient 12/10/21 1729     (approximate)   History   Chief Complaint Back Pain (Chronic , pain 8/10, waiting for pain clinic , ran out of lido patch )   HPI  Pamela Mcclure is a 66 y.o. female with past medical history of COPD and chronic back pain who presents to the ED complaining of back pain.  Patient reports that she has been dealing with pain in the middle of her lower back for multiple years.  She reports previously being prescribed 10 mg oxycodone when she was living in [place], has recently relocated to the area and is having difficulty establishing in the pain management clinic.  She denies any new injuries, has not had any falls or other trauma to her back.  She states the pain is sharp and exacerbated by movement, no worse than it usually is.  She has not had any urinary retention/incontinence, denies saddle anesthesia, numbness or weakness in her legs.  She reports partial relief of pain with Lidoderm patches prescribed at the time of her last ED visit.      Physical Exam   Triage Vital Signs: ED Triage Vitals  Enc Vitals Group     BP 12/10/21 1725 (!) 165/95     Pulse Rate 12/10/21 1725 80     Resp 12/10/21 1725 17     Temp 12/10/21 1725 98.8 F (37.1 C)     Temp Source 12/10/21 1725 Oral     SpO2 12/10/21 1725 99 %     Weight --      Height --      Head Circumference --      Peak Flow --      Pain Score 12/10/21 1726 8     Pain Loc --      Pain Edu? --      Excl. in [place]? --     Most recent vital signs: Vitals:   12/10/21 1725 12/10/21 1819  BP: (!) 165/95 (!) 155/74  Pulse: 80 75  Resp: 17 17  Temp: 98.8 F (37.1 C) 98.5 F (36.9 C)  SpO2: 99% 96%    Constitutional: Alert and oriented. Eyes: Conjunctivae are normal. Head: Atraumatic. Nose: No congestion/rhinnorhea. Mouth/Throat: Mucous membranes are moist.  Cardiovascular: Normal rate, regular  rhythm. Grossly normal heart sounds.  2+ radial and DP pulses bilaterally. Respiratory: Normal respiratory effort.  No retractions. Lungs CTAB. Gastrointestinal: Soft and nontender. No distention. Musculoskeletal: No lower extremity tenderness nor edema.  Midline lumbar spinal tenderness to palpation noted. Neurologic:  Normal speech and language. No gross focal neurologic deficits are appreciated.    ED Results / Procedures / Treatments   Labs (all labs ordered are listed, but only abnormal results are displayed) Labs Reviewed - No data to display   PROCEDURES:  Critical Care performed: No  Procedures   MEDICATIONS ORDERED IN ED: Medications  lidocaine (LIDODERM) 5 % 1 patch (1 patch Transdermal Patch Applied 12/10/21 1755)  oxyCODONE-acetaminophen (PERCOCET/ROXICET) 5-325 MG per tablet 2 tablet (2 tablets Oral Given 12/10/21 1755)     IMPRESSION / MDM / ASSESSMENT AND PLAN / ED COURSE  I reviewed the triage vital signs and the nursing notes.                              66 y.o. female with past  medical history of COPD and chronic back pain who presents to the ED complaining of midline low back pain going on for multiple years, now with difficulty establishing care with pain management.  Differential diagnosis includes, but is not limited to, chronic back pain, cauda equina, lumbar spinal fracture, lumbar strain, lumbar radiculopathy.  Patient is nontoxic-appearing and in no acute distress, vital signs are reassuring and she is neurovascular intact to her bilateral lower extremities.  Patient was seen in the ED for similar complaints last month and had x-ray imaging at that time that was unremarkable.  Given no recent trauma I do not feel repeat imaging is needed, no findings to suggest cauda equina.  We will treat symptomatically with one-time dose of Percocet, place Lidoderm patch.  She is appropriate for outpatient follow-up and was again provided with referral to pain  management, prescribed muscle relaxants and Lidoderm patches to take in addition to Tylenol and ibuprofen.  She was counseled to return to the ED for new worsening symptoms, patient agrees with plan.       FINAL CLINICAL IMPRESSION(S) / ED DIAGNOSES   Final diagnoses:  Chronic midline low back pain without sciatica     Rx / DC Orders   ED Discharge Orders          Ordered    lidocaine (LIDODERM) 5 %  Every 12 hours        12/10/21 1815    cyclobenzaprine (FLEXERIL) 5 MG tablet  3 times daily PRN        12/10/21 1815             Note:  This document was prepared using Dragon voice recognition software and may include unintentional dictation errors.   Chesley Noon, MD 12/10/21 [phone]

## 2021-12-10 NOTE — ED Triage Notes (Signed)
Chronic backpain , copd, no other complaints

## 2021-12-11 ENCOUNTER — Emergency Department
Admission: EM | Admit: 2021-12-11 | Discharge: 2021-12-11 | Disposition: A | Discharge status: Home / Self Care | Payer: Medicare Other | Attending: Emergency Medicine | Admitting: Emergency Medicine

## 2021-12-11 ENCOUNTER — Other Ambulatory Visit: Payer: Self-pay

## 2021-12-11 DIAGNOSIS — G8929 Other chronic pain: Secondary | ICD-10-CM | POA: Diagnosis not present

## 2021-12-11 DIAGNOSIS — J449 Chronic obstructive pulmonary disease, unspecified: Secondary | ICD-10-CM | POA: Diagnosis not present

## 2021-12-11 DIAGNOSIS — M545 Low back pain, unspecified: Secondary | ICD-10-CM

## 2021-12-11 MED ORDER — OXYCODONE-ACETAMINOPHEN 5-325 MG PO TABS
2.0000 | ORAL_TABLET | Freq: Once | ORAL | Status: AC
  Administered 2021-12-11: 2 via ORAL
  Filled 2021-12-11: qty 2

## 2021-12-11 NOTE — ED Provider Notes (Signed)
[facility] Provider Note    Event Date/Time   First MD Initiated Contact with Patient 12/11/21 [phone]     (approximate)  History   Chief Complaint: Back Pain  HPI  Pamela Mcclure is a 66 y.o. female with a past medical history of chronic lower back pain, COPD, presents to the emergency department for lower back pain.  According to the patient she is from [place] recently came down to the area for family issue but is planning to go back to [place].  Patient states she is on oxycodone 10 mg tablets for chronic lower back pain, but ran out of these medications and does not have any on her person as they are in [place].  Patient was seen in the emergency department yesterday for the same.  Was given a dose of pain medication.  Patient spent the night in the waiting room as she states she did not have a ride to get back home and does not have any money for a taxi.  Patient is here again this morning for the same back pain.  Denies any incontinence or any new symptoms.  Physical Exam   Triage Vital Signs: ED Triage Vitals  Enc Vitals Group     BP 12/11/21 0920 (!) 164/78     Pulse Rate 12/11/21 0920 63     Resp 12/11/21 0920 16     Temp 12/11/21 0920 97.6 F (36.4 C)     Temp Source 12/11/21 0920 Oral     SpO2 12/11/21 0920 98 %     Weight --      Height --      Head Circumference --      Peak Flow --      Pain Score 12/11/21 0910 10     Pain Loc --      Pain Edu? --      Excl. in [place]? --     Most recent vital signs: Vitals:   12/11/21 0920  BP: (!) 164/78  Pulse: 63  Resp: 16  Temp: 97.6 F (36.4 C)  SpO2: 98%    General: Awake, no distress.  CV:  Good peripheral perfusion.  Regular rate and rhythm  Resp:  Normal effort.  Equal breath sounds bilaterally.  Abd:  No distention.  Soft, nontender.  No rebound or guarding.    MEDICATIONS ORDERED IN ED: Medications  oxyCODONE-acetaminophen (PERCOCET/ROXICET) 5-325 MG per tablet 2 tablet (has no  administration in time range)     IMPRESSION / MDM / ASSESSMENT AND PLAN / ED COURSE  I reviewed the triage vital signs and the nursing notes.  Patient presents the emergency department for chronic back pain.  I discussed with the patient as she is on chronic pain medication managed by her pain management specialist we would not be writing for any controlled medications going home.  I reviewed the patient's ER chart from yesterday patient was given gabapentin.  Patient states she has no way to get home which is why she spent the night in the waiting room.  We will dose a one-time dose of pain medication in the emergency department.  I did discuss with the patient that we cannot provide ongoing chronic pain management for her and that she needs to follow-up with her pain specialist.  We will attempt to obtain a bus ticket for the patient to help get her home.  Patient agreeable to plan of care.  There were no concerning findings on  my physical exam.  No red flags.  FINAL CLINICAL IMPRESSION(S) / ED DIAGNOSES   Chronic lower back pain   Note:  This document was prepared using Dragon voice recognition software and may include unintentional dictation errors.   Minna Antis, MD 12/11/21 [phone]

## 2021-12-11 NOTE — ED Notes (Signed)
See triage note  presents with cont'd back pain  states she was seen yesterday had to stay in lobby all night  states she did not have a ride home

## 2021-12-11 NOTE — ED Triage Notes (Signed)
Pt comes with c/o chronic back pain. Pt states she was seen here last night but the pain is worse. Pt denies any recent injuries.

## 2021-12-11 NOTE — Discharge Instructions (Signed)
As we discussed please follow-up with your pain management specialist regarding ongoing pain management/medications.  We are unable to provide further pain management from the emergency department for your chronic pain condition.

## 2021-12-25 ENCOUNTER — Encounter: Payer: Self-pay | Admitting: Emergency Medicine

## 2021-12-25 ENCOUNTER — Emergency Department
Admission: EM | Admit: 2021-12-25 | Discharge: 2021-12-25 | Disposition: A | Discharge status: Home / Self Care | Payer: Medicare Other | Attending: Student in an Organized Health Care Education/Training Program | Admitting: Student in an Organized Health Care Education/Training Program

## 2021-12-25 ENCOUNTER — Other Ambulatory Visit: Payer: Self-pay

## 2021-12-25 DIAGNOSIS — G8929 Other chronic pain: Secondary | ICD-10-CM | POA: Insufficient documentation

## 2021-12-25 DIAGNOSIS — M545 Low back pain, unspecified: Secondary | ICD-10-CM | POA: Insufficient documentation

## 2021-12-25 MED ORDER — ALBUTEROL SULFATE HFA 108 (90 BASE) MCG/ACT IN AERS: 2.0000 | INHALATION_SPRAY | Freq: Four times a day (QID) | RESPIRATORY_TRACT | 2 refills | Status: AC | PRN

## 2021-12-25 MED ORDER — OXYCODONE HCL 5 MG PO TABS: 5.0000 mg | ORAL_TABLET | Freq: Three times a day (TID) | ORAL | 0 refills | Status: AC | PRN

## 2021-12-25 MED ORDER — OXYCODONE HCL 5 MG PO TABS
5.0000 mg | ORAL_TABLET | Freq: Once | ORAL | Status: AC
  Administered 2021-12-25: 5 mg via ORAL
  Filled 2021-12-25: qty 1

## 2021-12-25 MED ORDER — LIDOCAINE 5 % EX PTCH: 1.0000 | MEDICATED_PATCH | Freq: Two times a day (BID) | CUTANEOUS | 0 refills | Status: AC

## 2021-12-25 MED ORDER — GABAPENTIN 100 MG PO CAPS: 100.0000 mg | ORAL_CAPSULE | Freq: Three times a day (TID) | ORAL | 0 refills | Status: AC

## 2021-12-25 NOTE — ED Provider Triage Note (Signed)
°  Emergency Medicine Provider Triage Evaluation Note  Pamela Mcclure , a 66 y.o.female,  was evaluated in triage.  Pt complains of back pain.  Patient states that she has had chronic back pain around the lumbar 5 region for years.  Reports running out of her medications.  She is unable to get an appointment with her doctor in [place] until Thursday, but needs relief for now.  Additional endorses tingling sensation in her right foot, which is chronic.   Review of Systems  Positive: Low back pain Negative: Denies fever, chest pain, vomiting  Physical Exam   Vitals:   12/25/21 1542  BP: 124/80  Pulse: 83  Resp: 16  Temp: 98.4 F (36.9 C)  SpO2: 97%   Gen:   Awake, no distress   Resp:  Normal effort  MSK:   Moves extremities without difficulty  Other:    Medical Decision Making  Given the patient's initial medical screening exam, the following diagnostic evaluation has been ordered. The patient will be placed in the appropriate treatment space, once one is available, to complete the evaluation and treatment. I have discussed the plan of care with the patient and I have advised the patient that an ED physician or mid-level practitioner will reevaluate their condition after the test results have been received, as the results may give them additional insight into the type of treatment they may need.    Diagnostics: None immediately.  Treatments: Oxycodone   Teodoro Spray, [place] 12/25/21 1551

## 2021-12-25 NOTE — ED Provider Notes (Signed)
[facility] Provider Note    Event Date/Time   First MD Initiated Contact with Patient 12/25/21 1645     (approximate)   History   Back Pain   HPI  Pamela Mcclure is a 66 y.o. female patient from [place] with history of chronic back pain on oxycodone gabapentin, Lidoderm patches presents to the ER again for pain control.  States that she does not have her oxycodone prescription with her.  She is planning on going back to [place] the end of this week.  Is hoping to help get some assistance she has not been able to establish care with PCP locally in the interim.  On review of PMP does not appear that she has had any recent prescriptions filled.  Denies any new red flag symptoms no numbness or tingling no saddle anesthesia no loss of continence.  No trauma recently.  No fevers no dysuria.     Physical Exam   Triage Vital Signs: ED Triage Vitals  Enc Vitals Group     BP 12/25/21 1542 124/80     Pulse Rate 12/25/21 1542 83     Resp 12/25/21 1542 16     Temp 12/25/21 1542 98.4 F (36.9 C)     Temp Source 12/25/21 1542 Oral     SpO2 12/25/21 1542 97 %     Weight 12/25/21 1543 132 lb 4.4 oz (60 kg)     Height 12/25/21 1543 5\' 8"  (1.727 m)     Head Circumference --      Peak Flow --      Pain Score 12/25/21 1543 10     Pain Loc --      Pain Edu? --      Excl. in [place]? --     Most recent vital signs: Vitals:   12/25/21 1542  BP: 124/80  Pulse: 83  Resp: 16  Temp: 98.4 F (36.9 C)  SpO2: 97%     Constitutional: Alert  Eyes: Conjunctivae are normal.  Head: Atraumatic. Nose: No congestion/rhinnorhea. Mouth/Throat: Mucous membranes are moist.   Neck: Painless ROM.  Cardiovascular:   Good peripheral circulation. Respiratory: Normal respiratory effort.  No retractions.  Gastrointestinal: Soft and nontender.  Musculoskeletal:  no deformity Neurologic:  MAE spontaneously. No gross focal neurologic deficits are appreciated.  Skin:  Skin is warm, dry  and intact. No rash noted. Psychiatric: Mood and affect are normal. Speech and behavior are normal.    ED Results / Procedures / Treatments   Labs (all labs ordered are listed, but only abnormal results are displayed) Labs Reviewed - No data to display   EKG     RADIOLOGY    PROCEDURES:  Critical Care performed:   Procedures   MEDICATIONS ORDERED IN ED: Medications  oxyCODONE (Oxy IR/ROXICODONE) immediate release tablet 5 mg (5 mg Oral Given 12/25/21 1552)     IMPRESSION / MDM / ASSESSMENT AND PLAN / ED COURSE  I reviewed the triage vital signs and the nursing notes.                              Differential diagnosis includes, but is not limited to, lumbago, sciatica, degenerative disc disease, cauda equina, spinal stenosis, AAA, stone, pyelo-  Patient presents to the ER for symptoms as described above consistent with her chronic back pain.  Discussed the need to follow-up with PCP but understand her limited ability to establish care  locally.  She states that she is planning on following up with her PCP back in [place] this week.  Given the chronic nature of this do not feel that further diagnostic testing or imaging clinically indicated.  I have ordered refills on her chronic medications and a short course of oxycodone to hopefully get her through until she is able to get home to her PCP.        FINAL CLINICAL IMPRESSION(S) / ED DIAGNOSES   Final diagnoses:  Chronic low back pain without sciatica, unspecified back pain laterality     Rx / DC Orders   ED Discharge Orders          Ordered    gabapentin (NEURONTIN) 100 MG capsule  3 times daily        12/25/21 1656    lidocaine (LIDODERM) 5 %  Every 12 hours        12/25/21 1656    oxyCODONE (ROXICODONE) 5 MG immediate release tablet  Every 8 hours PRN        12/25/21 1656    albuterol (VENTOLIN HFA) 108 (90 Mcclure) MCG/ACT inhaler  Every 6 hours PRN        12/25/21 1656             Note:  This  document was prepared using Dragon voice recognition software and may include unintentional dictation errors.    Willy Eddy, MD 12/25/21 [phone]

## 2021-12-25 NOTE — Discharge Instructions (Signed)

## 2021-12-25 NOTE — ED Triage Notes (Signed)
Pt to ED via EMS with c/o chronic pain, pt is here for her brothers funeral and usually takes oxycotin 10's and did not bring her medication. Denies any new injury. Denies any GI issues

## 2021-12-25 NOTE — ED Triage Notes (Signed)
FIRST NURSE NOTE: Pt via EMS [place] the side of the road, pt c/o back pain and has a hx of osteoporosis.   153/98  97% on RA  78 HR

## 2022-05-19 IMAGING — CR DG LUMBAR SPINE COMPLETE 4+V
1 series · 5 of 5 positions shown · non-contrast
Comparison: 12/30/2014

CLINICAL DATA: Back pain

EXAM:
LUMBAR SPINE - COMPLETE 4+ VIEW

[Series 1: dg lumbar spine complete 4 +v · 0.14mm/px · 5 of 5 slices shown]
[im 1/5]
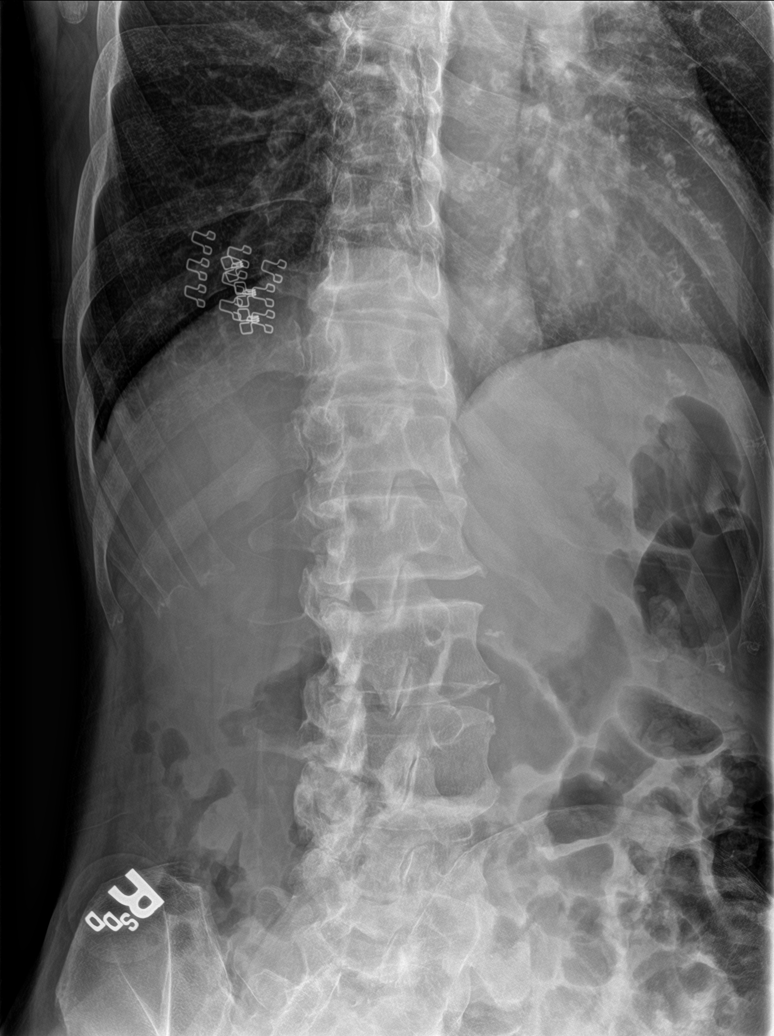
[im 2/5]
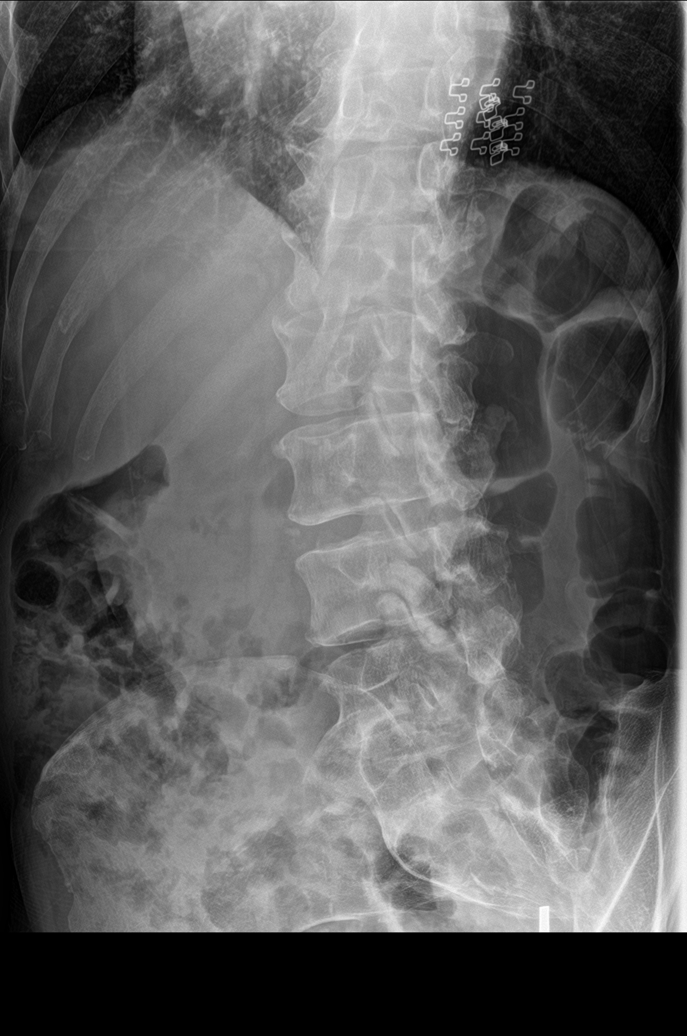
[im 3/5]
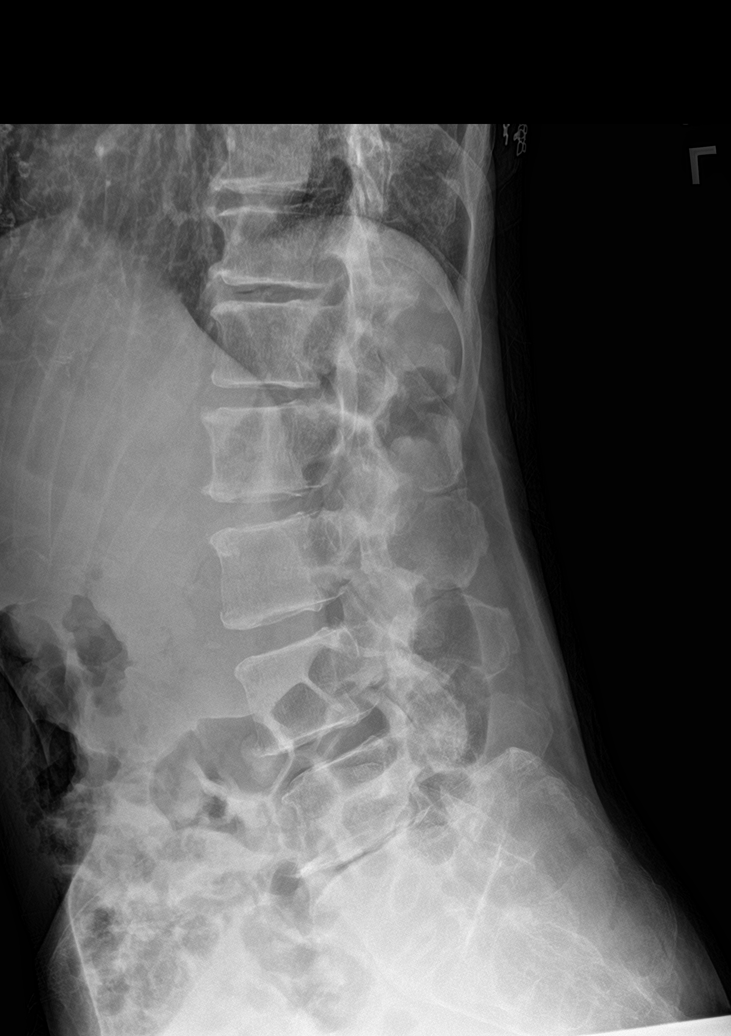
[im 4/5]
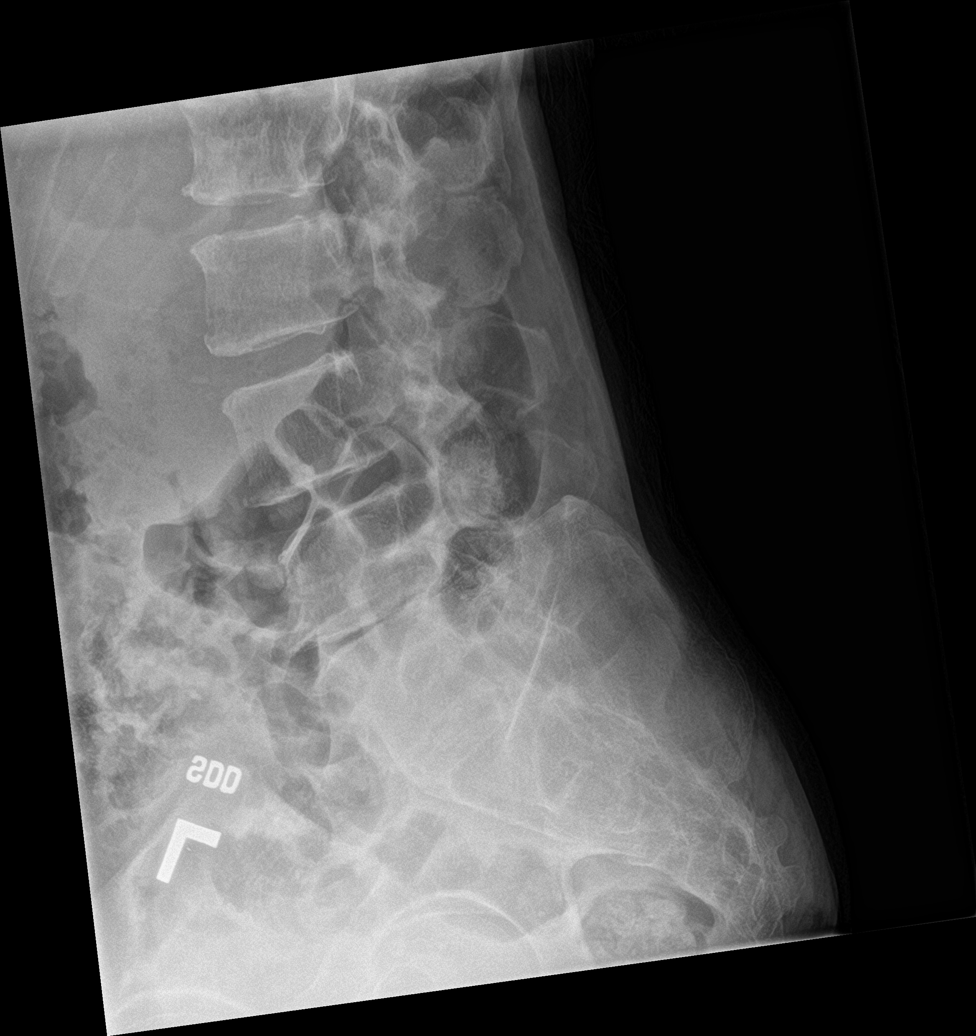
[im 5/5]
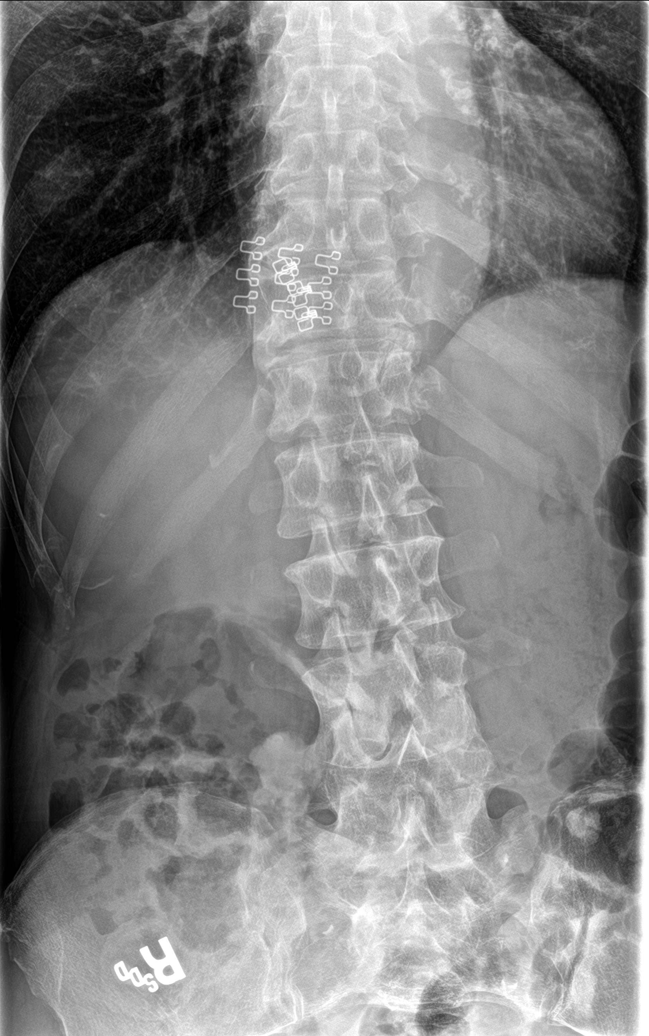

[5 of 5 positions shown; findings below may reference images not displayed]

FINDINGS: Transitional lumbar anatomy with sacralization of L5. No acute
fracture or listhesis. Vertebral body height is preserved. Severe
intervertebral disc space narrowing and endplate remodeling at L4-5
is again noted in keeping with changes of advanced degenerative disc
disease. Mild progressive degenerative disc disease is noted at
T12-L1 and L1-L2 with endplate remodeling. Moderate facet arthrosis
on the right at L3-4. The paraspinal soft tissues are unremarkable.
IMPRESSION: No acute fracture or listhesis. Progressive degenerative change.
Transitional lumbar anatomy.
